# Patient Record
Sex: Female | Born: 2000 | Race: Black or African American | Hispanic: No | Marital: Single | Smoking: Never smoker
Health system: Southern US, Community
[De-identification: ages and names within clinical notes are randomized; demographics above are authoritative.]

## PROBLEM LIST (undated history)

## (undated) DIAGNOSIS — Z87828 Personal history of other (healed) physical injury and trauma: Secondary | ICD-10-CM

## (undated) DIAGNOSIS — S42009A Fracture of unspecified part of unspecified clavicle, initial encounter for closed fracture: Secondary | ICD-10-CM

## (undated) HISTORY — PX: NO PAST SURGERIES: SHX2092

---
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
## 2020-04-04 ENCOUNTER — Encounter: Payer: Self-pay | Admitting: *Deleted

## 2020-04-04 ENCOUNTER — Other Ambulatory Visit: Payer: Self-pay

## 2020-04-04 ENCOUNTER — Emergency Department
Admission: EM | Admit: 2020-04-04 | Discharge: 2020-04-04 | Disposition: A | Discharge status: Home / Self Care | Payer: No Typology Code available for payment source | Attending: Emergency Medicine | Admitting: Emergency Medicine

## 2020-04-04 DIAGNOSIS — R109 Unspecified abdominal pain: Secondary | ICD-10-CM | POA: Diagnosis present

## 2020-04-04 DIAGNOSIS — Z5321 Procedure and treatment not carried out due to patient leaving prior to being seen by health care provider: Secondary | ICD-10-CM | POA: Diagnosis not present

## 2020-04-04 LAB — COMPREHENSIVE METABOLIC PANEL
ALT: 12 U/L (ref 0–44)
AST: 14 U/L — ABNORMAL LOW (ref 15–41)
Albumin: 4.4 g/dL (ref 3.5–5.0)
Alkaline Phosphatase: 67 U/L (ref 38–126)
Anion gap: 7 (ref 5–15)
BUN: 14 mg/dL (ref 6–20)
CO2: 26 mmol/L (ref 22–32)
Calcium: 9 mg/dL (ref 8.9–10.3)
Chloride: 105 mmol/L (ref 98–111)
Creatinine, Ser: 0.89 mg/dL (ref 0.44–1.00)
GFR calc Af Amer: >60 mL/min (ref >60)
GFR calc non Af Amer: >60 mL/min (ref >60)
Glucose, Bld: 93 mg/dL (ref 70–99)
Potassium: 3.6 mmol/L (ref 3.5–5.1)
Sodium: 138 mmol/L (ref 135–145)
Total Bilirubin: 0.8 mg/dL (ref 0.3–1.2)
Total Protein: 7.5 g/dL (ref 6.5–8.1)

## 2020-04-04 LAB — URINALYSIS, COMPLETE (UACMP) WITH MICROSCOPIC
Bacteria, UA: NONE SEEN
Bilirubin Urine: NEGATIVE
Glucose, UA: NEGATIVE mg/dL
Hgb urine dipstick: NEGATIVE
Ketones, ur: NEGATIVE mg/dL
Leukocytes,Ua: NEGATIVE
Nitrite: NEGATIVE
Protein, ur: NEGATIVE mg/dL
Specific Gravity, Urine: 1.028 (ref 1.005–1.030)
pH: 5 (ref 5.0–8.0)

## 2020-04-04 LAB — CBC
HCT: 35.6 % — ABNORMAL LOW (ref 36.0–46.0)
Hemoglobin: 11.8 g/dL — ABNORMAL LOW (ref 12.0–15.0)
MCH: 28.2 pg (ref 26.0–34.0)
MCHC: 33.1 g/dL (ref 30.0–36.0)
MCV: 85.2 fL (ref 80.0–100.0)
Platelets: 237 10*3/uL (ref 150–400)
RBC: 4.18 MIL/uL (ref 3.87–5.11)
RDW: 12.2 % (ref 11.5–15.5)
WBC: 4.7 10*3/uL (ref 4.0–10.5)
nRBC: 0 % (ref 0.0–0.2)

## 2020-04-04 LAB — LIPASE, BLOOD: Lipase: 19 U/L (ref 11–51)

## 2020-04-04 LAB — POC URINE PREG, ED: Preg Test, Ur: NEGATIVE

## 2020-04-04 NOTE — ED Triage Notes (Signed)
PT to ED reporting RLQ abd pain x 2 weeks. Pain is intermittently sharp, tender upon palpation and worse after eating. No fevers, NVD or changes in urination.   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

## 2020-04-05 ENCOUNTER — Telehealth: Payer: Self-pay | Admitting: Emergency Medicine

## 2020-04-05 NOTE — Telephone Encounter (Signed)
Called patient due to lwot to inquire about condition and follow up plans. Left message.   

## 2020-05-12 ENCOUNTER — Other Ambulatory Visit: Payer: Self-pay

## 2020-05-12 ENCOUNTER — Ambulatory Visit
Admission: EM | Admit: 2020-05-12 | Discharge: 2020-05-12 | Disposition: A | Discharge status: Home / Self Care | Payer: No Typology Code available for payment source | Attending: Family Medicine | Admitting: Family Medicine

## 2020-05-12 ENCOUNTER — Ambulatory Visit (INDEPENDENT_AMBULATORY_CARE_PROVIDER_SITE_OTHER): Payer: No Typology Code available for payment source

## 2020-05-12 DIAGNOSIS — S93401A Sprain of unspecified ligament of right ankle, initial encounter: Secondary | ICD-10-CM

## 2020-05-12 NOTE — ED Triage Notes (Signed)
Patient states that around 8pm last night she was walking her dog and the dogs leash got hung on her leg and made her fall. States that she has been having constant right ankle pain since.

## 2020-05-12 NOTE — ED Provider Notes (Signed)
[facility] ____________________________________________  Time seen: Approximately 12:01 PM  I have reviewed the triage vital signs and the nursing notes.   HISTORY  Chief Complaint Ankle Injury (right)   HPI Anita Frederick is a 19 y.o. female presenting for evaluation of right ankle pain after injury that occurred last night.  Patient reports that she was walking her dog, and the dog's leash got tied around her ankle and she rolled her ankle.  Reports some pain since.  Will still weight-bear.  Denies pain radiation or paresthesias.  Reports did fall but denies other injuries.  Reports otherwise doing well.  Denies aggravating or alleviating factors.  Patient's last menstrual period was 05/01/2020.    Past Medical History:  Diagnosis Date  . Broken collarbone   . History of sprained ankle     There are no problems to display for this patient.   Past Surgical History:  Procedure Laterality Date  . NO PAST SURGERIES       No current facility-administered medications for this encounter.  Current Outpatient Medications:  .  cabergoline (DOSTINEX) 0.5 MG tablet, Take 0.25 mg by mouth 2 (two) times a week., Disp: , Rfl:  .  [place] 28 0.25-35 MG-MCG tablet, Take 1 tablet by mouth daily., Disp: , Rfl:   Allergies Orange fruit [citrus]  History reviewed. No pertinent family history.  Social History Social History   Tobacco Use  . Smoking status: Never Smoker  . Smokeless tobacco: Never Used  Substance Use Topics  . Alcohol use: No    Alcohol/week: 0.0 standard drinks  . Drug use: No    Review of Systems Constitutional: No fever Cardiovascular: Denies chest pain. Respiratory: Denies shortness of breath. Gastrointestinal: No abdominal pain.  No nausea, no vomiting.  No diarrhea.  Genitourinary: Negative for dysuria. Musculoskeletal: Positive right ankle pain. Skin: Negative for rash.   ____________________________________________   PHYSICAL  EXAM:  VITAL SIGNS: ED Triage Vitals  Enc Vitals Group     BP 05/12/20 0931 121/63     Pulse Rate 05/12/20 0931 70     Resp 05/12/20 0931 18     Temp 05/12/20 0931 98.2 F (36.8 C)     Temp Source 05/12/20 0931 Oral     SpO2 05/12/20 0931 99 %     Weight 05/12/20 0929 155 lb (70.3 kg)     Height 05/12/20 0929 5\' 8"  (1.727 m)     Head Circumference --      Peak Flow --      Pain Score 05/12/20 0929 8     Pain Loc --      Pain Edu? --      Excl. in [place]? --     Constitutional: Alert and oriented. Well appearing and in no acute distress. Eyes: Conjunctivae are normal.  ENT      Head: Normocephalic and atraumatic. Cardiovascular: Good peripheral circulation. Respiratory: Normal respiratory effort without tachypnea nor retractions.  Musculoskeletal: Steady gait.  Good distal right dorsalis pedis and posterior tibialis pulses. Right medial ankle along medial malleolus mild tenderness direct palpation, able to fully plantarflex dorsiflexion, mild pain with ankle rotation, right lower extremity otherwise nontender. Neurologic:  Normal speech and language. Speech is normal. No gait instability.  Skin:  Skin is warm, dry and intact. No rash noted. Psychiatric: Mood and affect are normal. Speech and behavior are normal. Patient exhibits appropriate insight and judgment   ___________________________________________   LABS (all labs ordered are listed, but only abnormal results  are displayed)  Labs Reviewed - No data to display  RADIOLOGY  DG Ankle Complete Right  Result Date: 05/12/2020 CLINICAL DATA:  Media pain started at 8 p.m. last night. Patient fell while walking her dog. EXAM: RIGHT ANKLE - COMPLETE 3+ VIEW COMPARISON:  None. FINDINGS: There is no evidence of fracture, dislocation, or joint effusion. There is no evidence of arthropathy or other focal bone abnormality. Soft tissues are unremarkable. IMPRESSION: Negative. Electronically Signed   By: Nolon Nations M.D.   On:  05/12/2020 10:44   ____________________________________________   PROCEDURES Procedures     INITIAL IMPRESSION / ASSESSMENT AND PLAN / ED COURSE  Pertinent labs & imaging results that were available during my care of the patient were reviewed by me and considered in my medical decision making (see chart for details).  Well-appearing patient.  No acute distress.  Right ankle pain post mechanical injury.  Right ankle x-ray as above radiologist, negative.  Suspect sprain injury.  Velcro ASO brace given for support.  Ice, elevate, Tylenol ibuprofen as needed.  Work note given.  Discussed follow up with Primary care physician this week as needed. Discussed follow up and return parameters including no resolution or any worsening concerns. Patient verbalized understanding and agreed to plan.   ____________________________________________   FINAL CLINICAL IMPRESSION(S) / ED DIAGNOSES  Final diagnoses:  Sprain of right ankle, unspecified ligament, initial encounter     ED Discharge Orders    None       Note: This dictation was prepared with Dragon dictation along with smaller phrase technology. Any transcriptional errors that result from this process are unintentional.         Marylene Land, NP 05/12/20 1204

## 2020-05-12 NOTE — Discharge Instructions (Signed)
Use brace for 3 days and as needed.  Ice.  Rest.  Elevate.  Gradually increase weightbearing and activity as tolerated.  Follow up with your primary care physician this week as needed. Return to Urgent care for new or worsening concerns.

## 2020-06-07 DIAGNOSIS — Z419 Encounter for procedure for purposes other than remedying health state, unspecified: Secondary | ICD-10-CM | POA: Diagnosis not present

## 2020-07-08 DIAGNOSIS — Z419 Encounter for procedure for purposes other than remedying health state, unspecified: Secondary | ICD-10-CM | POA: Diagnosis not present

## 2020-07-18 DIAGNOSIS — Z0000 Encounter for general adult medical examination without abnormal findings: Secondary | ICD-10-CM | POA: Diagnosis not present

## 2020-07-18 DIAGNOSIS — Z309 Encounter for contraceptive management, unspecified: Secondary | ICD-10-CM | POA: Diagnosis not present

## 2020-07-18 DIAGNOSIS — Z7189 Other specified counseling: Secondary | ICD-10-CM | POA: Diagnosis not present

## 2020-07-18 DIAGNOSIS — F418 Other specified anxiety disorders: Secondary | ICD-10-CM | POA: Diagnosis not present

## 2020-07-18 DIAGNOSIS — Z23 Encounter for immunization: Secondary | ICD-10-CM | POA: Diagnosis not present

## 2020-07-18 DIAGNOSIS — Z1389 Encounter for screening for other disorder: Secondary | ICD-10-CM | POA: Diagnosis not present

## 2020-07-23 DIAGNOSIS — Z20822 Contact with and (suspected) exposure to covid-19: Secondary | ICD-10-CM | POA: Diagnosis not present

## 2020-08-08 DIAGNOSIS — Z419 Encounter for procedure for purposes other than remedying health state, unspecified: Secondary | ICD-10-CM | POA: Diagnosis not present

## 2020-08-19 DIAGNOSIS — Z20822 Contact with and (suspected) exposure to covid-19: Secondary | ICD-10-CM | POA: Diagnosis not present

## 2020-09-07 DIAGNOSIS — Z419 Encounter for procedure for purposes other than remedying health state, unspecified: Secondary | ICD-10-CM | POA: Diagnosis not present

## 2020-09-26 DIAGNOSIS — L309 Dermatitis, unspecified: Secondary | ICD-10-CM | POA: Diagnosis not present

## 2020-10-08 DIAGNOSIS — Z419 Encounter for procedure for purposes other than remedying health state, unspecified: Secondary | ICD-10-CM | POA: Diagnosis not present

## 2020-10-29 DIAGNOSIS — D352 Benign neoplasm of pituitary gland: Secondary | ICD-10-CM | POA: Diagnosis not present

## 2020-11-02 DIAGNOSIS — H5213 Myopia, bilateral: Secondary | ICD-10-CM | POA: Diagnosis not present

## 2020-11-07 DIAGNOSIS — Z419 Encounter for procedure for purposes other than remedying health state, unspecified: Secondary | ICD-10-CM | POA: Diagnosis not present

## 2020-12-08 DIAGNOSIS — Z419 Encounter for procedure for purposes other than remedying health state, unspecified: Secondary | ICD-10-CM | POA: Diagnosis not present

## 2020-12-18 ENCOUNTER — Other Ambulatory Visit: Payer: Self-pay

## 2020-12-18 ENCOUNTER — Ambulatory Visit
Admission: EM | Admit: 2020-12-18 | Discharge: 2020-12-18 | Disposition: A | Discharge status: Home / Self Care | Payer: Medicaid Other | Attending: Sports Medicine | Admitting: Sports Medicine

## 2020-12-18 DIAGNOSIS — Z20822 Contact with and (suspected) exposure to covid-19: Secondary | ICD-10-CM | POA: Diagnosis not present

## 2020-12-18 NOTE — Discharge Instructions (Signed)

## 2020-12-18 NOTE — ED Triage Notes (Signed)
Patient states that she has was exposed to covid and currently does not have symptoms.

## 2020-12-19 LAB — SARS CORONAVIRUS 2 (TAT 6-24 HRS): SARS Coronavirus 2: NEGATIVE

## 2021-01-08 DIAGNOSIS — Z419 Encounter for procedure for purposes other than remedying health state, unspecified: Secondary | ICD-10-CM | POA: Diagnosis not present

## 2021-02-05 DIAGNOSIS — Z419 Encounter for procedure for purposes other than remedying health state, unspecified: Secondary | ICD-10-CM | POA: Diagnosis not present

## 2021-02-14 IMAGING — CR DG ANKLE COMPLETE 3+V*R*
3 series · 3 of 3 positions shown · non-contrast
Comparison: None.

CLINICAL DATA: Media pain started at 8 p.m. last night. Patient
fell while walking her dog.

EXAM:
RIGHT ANKLE - COMPLETE 3+ VIEW

[ankle ap]
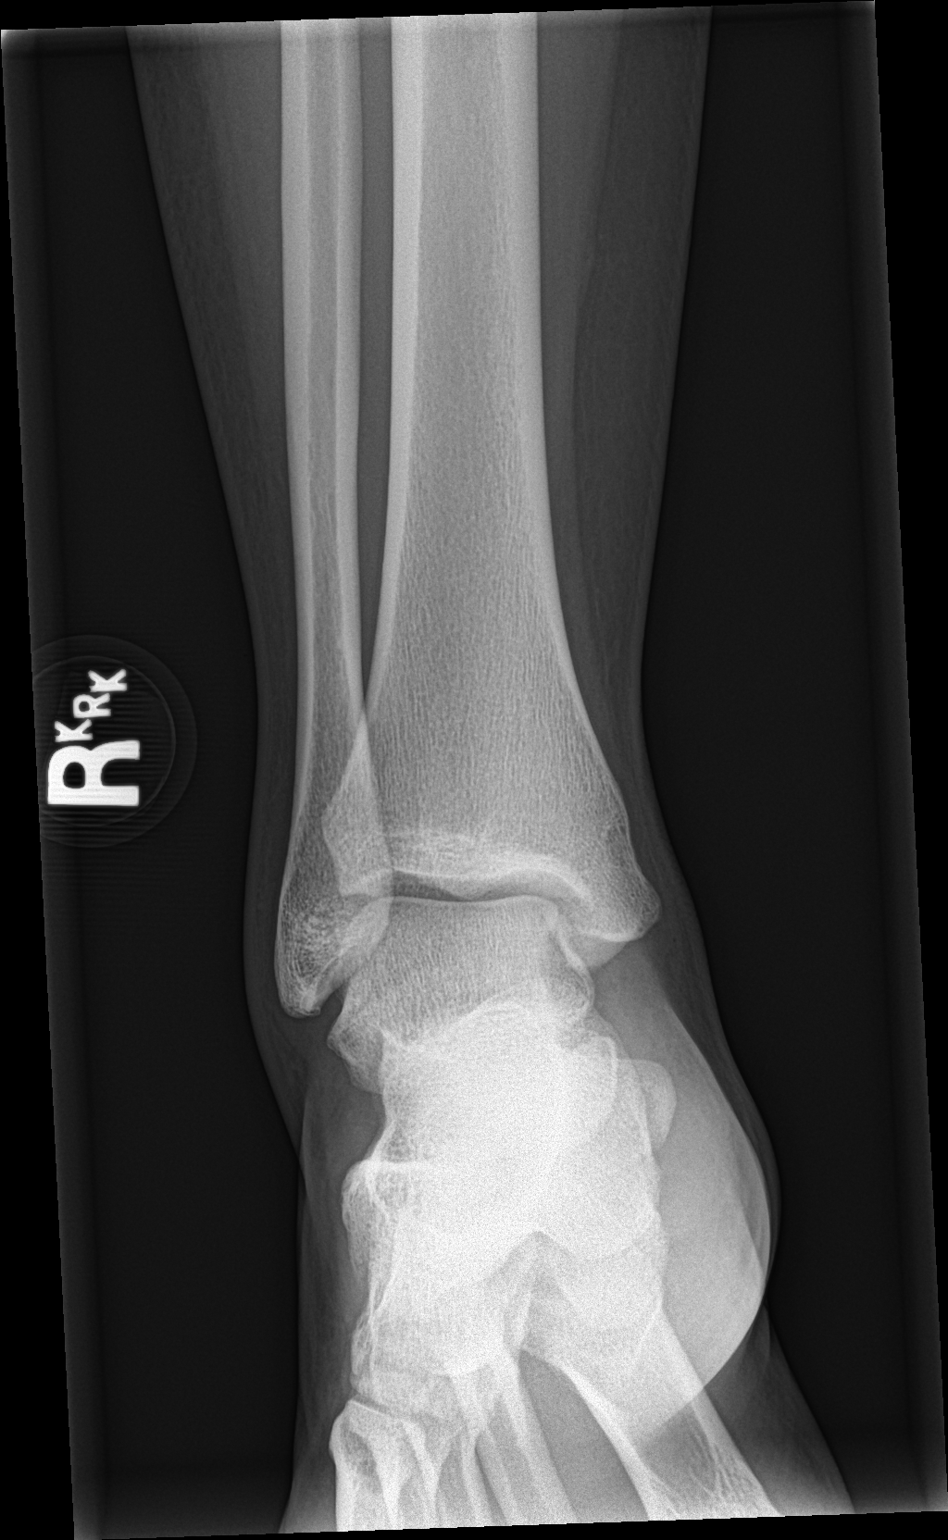

[ankle obl]
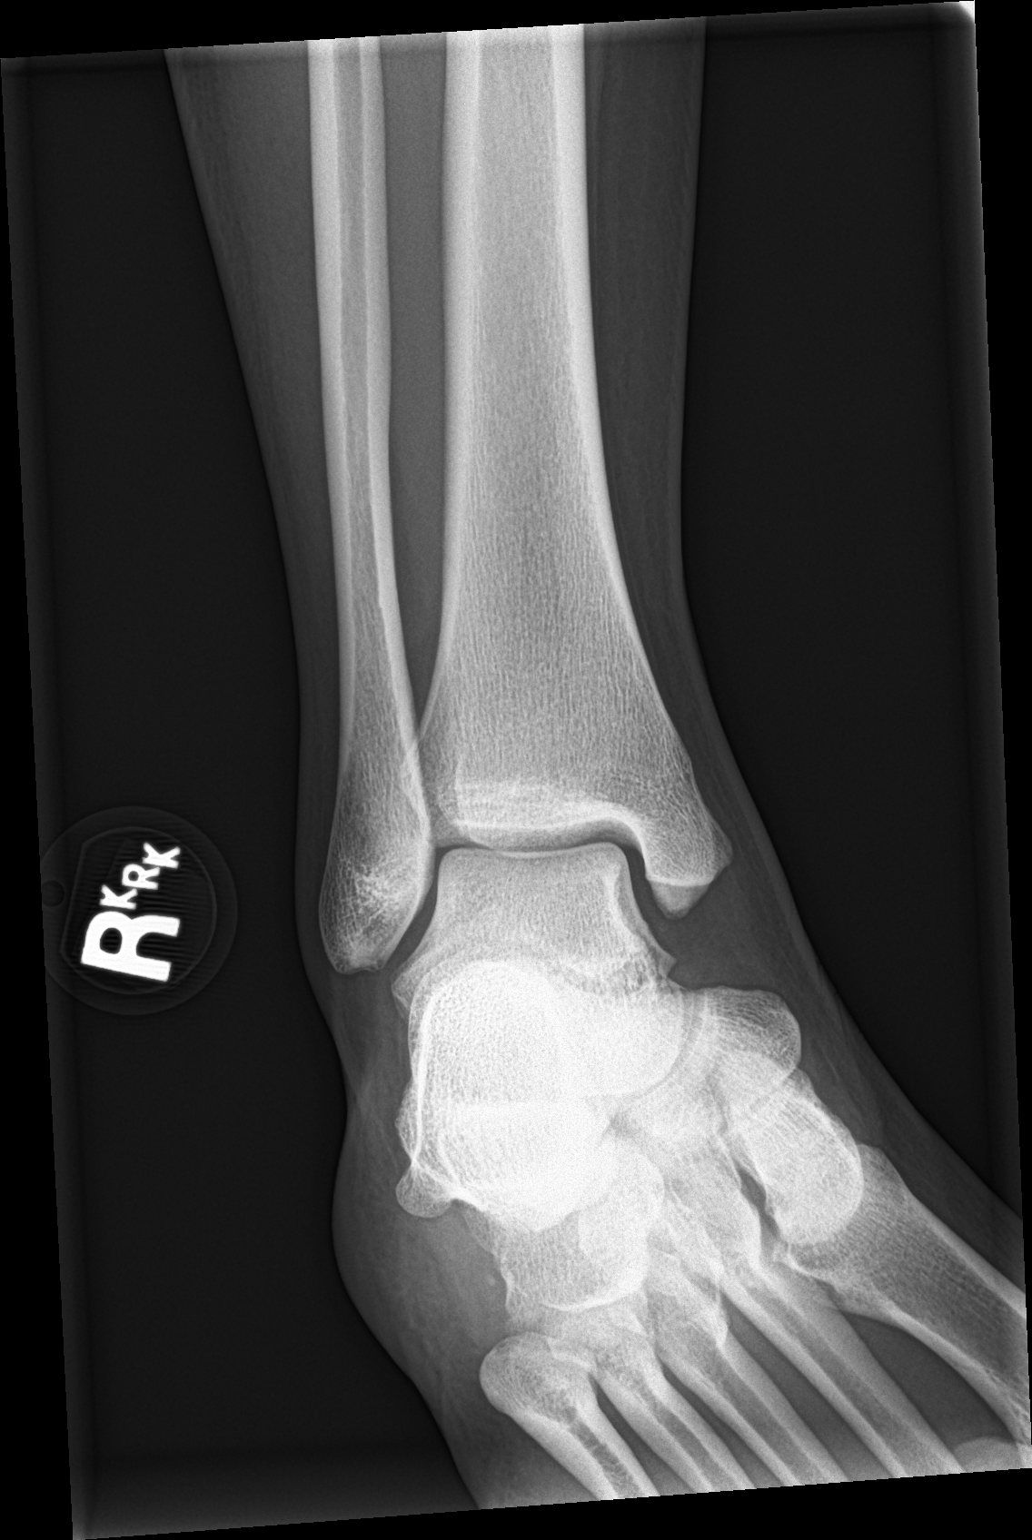

[ankle lat]
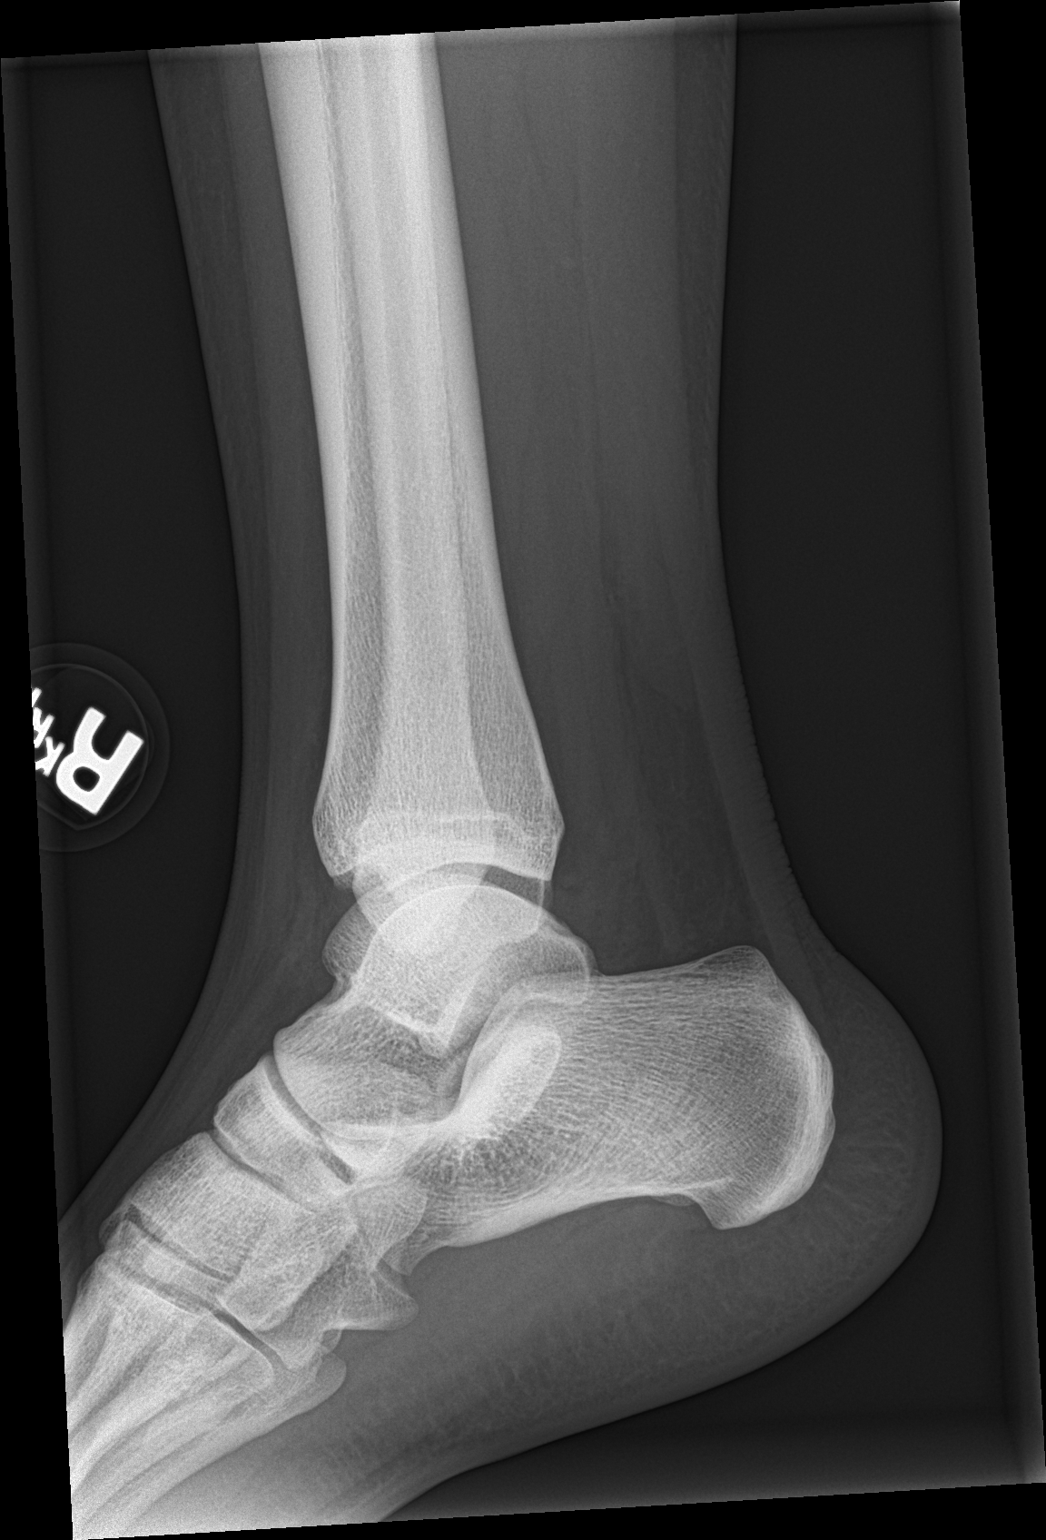

[3 of 3 positions shown; findings below may reference images not displayed]

FINDINGS: There is no evidence of fracture, dislocation, or joint effusion.
There is no evidence of arthropathy or other focal bone abnormality.
Soft tissues are unremarkable.
IMPRESSION: Negative.

## 2021-03-08 DIAGNOSIS — Z419 Encounter for procedure for purposes other than remedying health state, unspecified: Secondary | ICD-10-CM | POA: Diagnosis not present

## 2021-04-07 DIAGNOSIS — Z419 Encounter for procedure for purposes other than remedying health state, unspecified: Secondary | ICD-10-CM | POA: Diagnosis not present

## 2021-05-08 DIAGNOSIS — Z419 Encounter for procedure for purposes other than remedying health state, unspecified: Secondary | ICD-10-CM | POA: Diagnosis not present

## 2021-06-07 DIAGNOSIS — Z419 Encounter for procedure for purposes other than remedying health state, unspecified: Secondary | ICD-10-CM | POA: Diagnosis not present

## 2021-07-08 DIAGNOSIS — Z419 Encounter for procedure for purposes other than remedying health state, unspecified: Secondary | ICD-10-CM | POA: Diagnosis not present

## 2021-08-08 DIAGNOSIS — Z419 Encounter for procedure for purposes other than remedying health state, unspecified: Secondary | ICD-10-CM | POA: Diagnosis not present

## 2021-09-07 DIAGNOSIS — Z419 Encounter for procedure for purposes other than remedying health state, unspecified: Secondary | ICD-10-CM | POA: Diagnosis not present

## 2021-10-08 DIAGNOSIS — Z419 Encounter for procedure for purposes other than remedying health state, unspecified: Secondary | ICD-10-CM | POA: Diagnosis not present

## 2021-11-07 DIAGNOSIS — Z419 Encounter for procedure for purposes other than remedying health state, unspecified: Secondary | ICD-10-CM | POA: Diagnosis not present

## 2021-12-08 DIAGNOSIS — Z419 Encounter for procedure for purposes other than remedying health state, unspecified: Secondary | ICD-10-CM | POA: Diagnosis not present

## 2022-01-08 DIAGNOSIS — Z419 Encounter for procedure for purposes other than remedying health state, unspecified: Secondary | ICD-10-CM | POA: Diagnosis not present

## 2022-02-05 DIAGNOSIS — Z419 Encounter for procedure for purposes other than remedying health state, unspecified: Secondary | ICD-10-CM | POA: Diagnosis not present

## 2022-03-08 DIAGNOSIS — Z419 Encounter for procedure for purposes other than remedying health state, unspecified: Secondary | ICD-10-CM | POA: Diagnosis not present

## 2022-04-07 DIAGNOSIS — Z419 Encounter for procedure for purposes other than remedying health state, unspecified: Secondary | ICD-10-CM | POA: Diagnosis not present

## 2022-05-08 DIAGNOSIS — Z419 Encounter for procedure for purposes other than remedying health state, unspecified: Secondary | ICD-10-CM | POA: Diagnosis not present

## 2022-05-15 DIAGNOSIS — R112 Nausea with vomiting, unspecified: Secondary | ICD-10-CM | POA: Diagnosis not present

## 2022-05-15 DIAGNOSIS — Z5321 Procedure and treatment not carried out due to patient leaving prior to being seen by health care provider: Secondary | ICD-10-CM | POA: Diagnosis not present

## 2022-05-15 DIAGNOSIS — R1030 Lower abdominal pain, unspecified: Secondary | ICD-10-CM | POA: Insufficient documentation

## 2022-05-16 ENCOUNTER — Emergency Department (HOSPITAL_COMMUNITY)
Admission: EM | Admit: 2022-05-16 | Discharge: 2022-05-16 | Disposition: A | Discharge status: Home / Self Care | Payer: Medicaid Other | Attending: Emergency Medicine | Admitting: Emergency Medicine

## 2022-05-16 ENCOUNTER — Other Ambulatory Visit: Payer: Self-pay

## 2022-05-16 ENCOUNTER — Encounter (HOSPITAL_COMMUNITY): Payer: Self-pay | Admitting: Emergency Medicine

## 2022-05-16 LAB — URINALYSIS, ROUTINE W REFLEX MICROSCOPIC
Bilirubin Urine: NEGATIVE
Glucose, UA: NEGATIVE mg/dL
Ketones, ur: NEGATIVE mg/dL
Nitrite: NEGATIVE
Protein, ur: NEGATIVE mg/dL
Specific Gravity, Urine: 1.011 (ref 1.005–1.030)
pH: 6 (ref 5.0–8.0)

## 2022-05-16 LAB — CBC
HCT: 35.4 % — ABNORMAL LOW (ref 36.0–46.0)
Hemoglobin: 11.4 g/dL — ABNORMAL LOW (ref 12.0–15.0)
MCH: 28.3 pg (ref 26.0–34.0)
MCHC: 32.2 g/dL (ref 30.0–36.0)
MCV: 87.8 fL (ref 80.0–100.0)
Platelets: 257 10*3/uL (ref 150–400)
RBC: 4.03 MIL/uL (ref 3.87–5.11)
RDW: 12 % (ref 11.5–15.5)
WBC: 6.3 10*3/uL (ref 4.0–10.5)
nRBC: 0 % (ref 0.0–0.2)

## 2022-05-16 LAB — COMPREHENSIVE METABOLIC PANEL
ALT: 11 U/L (ref 0–44)
AST: 17 U/L (ref 15–41)
Albumin: 4 g/dL (ref 3.5–5.0)
Alkaline Phosphatase: 69 U/L (ref 38–126)
Anion gap: 8 (ref 5–15)
BUN: 16 mg/dL (ref 6–20)
CO2: 26 mmol/L (ref 22–32)
Calcium: 9.3 mg/dL (ref 8.9–10.3)
Chloride: 104 mmol/L (ref 98–111)
Creatinine, Ser: 0.97 mg/dL (ref 0.44–1.00)
GFR, Estimated: >60 mL/min (ref >60)
Glucose, Bld: 82 mg/dL (ref 70–99)
Potassium: 3.7 mmol/L (ref 3.5–5.1)
Sodium: 138 mmol/L (ref 135–145)
Total Bilirubin: 0.5 mg/dL (ref 0.3–1.2)
Total Protein: 7.1 g/dL (ref 6.5–8.1)

## 2022-05-16 LAB — LIPASE, BLOOD: Lipase: 26 U/L (ref 11–51)

## 2022-05-16 LAB — I-STAT BETA HCG BLOOD, ED (MC, WL, AP ONLY): I-stat hCG, quantitative: <5 m[IU]/mL (ref <5)

## 2022-05-16 NOTE — ED Triage Notes (Signed)
Pt reported to ED with c/o pain to suprapubic area and nausea vomiting. Pt also states she has a possible infection to cuticle of 3rd finger on right hand.

## 2022-06-07 DIAGNOSIS — Z419 Encounter for procedure for purposes other than remedying health state, unspecified: Secondary | ICD-10-CM | POA: Diagnosis not present

## 2022-07-08 DIAGNOSIS — Z419 Encounter for procedure for purposes other than remedying health state, unspecified: Secondary | ICD-10-CM | POA: Diagnosis not present

## 2022-08-08 DIAGNOSIS — Z419 Encounter for procedure for purposes other than remedying health state, unspecified: Secondary | ICD-10-CM | POA: Diagnosis not present

## 2022-09-07 DIAGNOSIS — Z419 Encounter for procedure for purposes other than remedying health state, unspecified: Secondary | ICD-10-CM | POA: Diagnosis not present

## 2022-10-08 DIAGNOSIS — Z419 Encounter for procedure for purposes other than remedying health state, unspecified: Secondary | ICD-10-CM | POA: Diagnosis not present

## 2022-11-07 DIAGNOSIS — Z419 Encounter for procedure for purposes other than remedying health state, unspecified: Secondary | ICD-10-CM | POA: Diagnosis not present

## 2022-12-08 DIAGNOSIS — Z419 Encounter for procedure for purposes other than remedying health state, unspecified: Secondary | ICD-10-CM | POA: Diagnosis not present

## 2023-01-08 DIAGNOSIS — Z419 Encounter for procedure for purposes other than remedying health state, unspecified: Secondary | ICD-10-CM | POA: Diagnosis not present

## 2023-02-06 DIAGNOSIS — Z419 Encounter for procedure for purposes other than remedying health state, unspecified: Secondary | ICD-10-CM | POA: Diagnosis not present

## 2023-03-09 DIAGNOSIS — Z419 Encounter for procedure for purposes other than remedying health state, unspecified: Secondary | ICD-10-CM | POA: Diagnosis not present

## 2023-04-08 DIAGNOSIS — Z419 Encounter for procedure for purposes other than remedying health state, unspecified: Secondary | ICD-10-CM | POA: Diagnosis not present

## 2023-05-09 DIAGNOSIS — Z419 Encounter for procedure for purposes other than remedying health state, unspecified: Secondary | ICD-10-CM | POA: Diagnosis not present

## 2023-06-08 DIAGNOSIS — Z419 Encounter for procedure for purposes other than remedying health state, unspecified: Secondary | ICD-10-CM | POA: Diagnosis not present

## 2023-07-09 DIAGNOSIS — Z419 Encounter for procedure for purposes other than remedying health state, unspecified: Secondary | ICD-10-CM | POA: Diagnosis not present

## 2023-08-09 DIAGNOSIS — Z419 Encounter for procedure for purposes other than remedying health state, unspecified: Secondary | ICD-10-CM | POA: Diagnosis not present

## 2023-09-08 DIAGNOSIS — Z419 Encounter for procedure for purposes other than remedying health state, unspecified: Secondary | ICD-10-CM | POA: Diagnosis not present

## 2023-10-09 DIAGNOSIS — Z419 Encounter for procedure for purposes other than remedying health state, unspecified: Secondary | ICD-10-CM | POA: Diagnosis not present

## 2023-11-08 DIAGNOSIS — Z419 Encounter for procedure for purposes other than remedying health state, unspecified: Secondary | ICD-10-CM | POA: Diagnosis not present

## 2023-12-09 DIAGNOSIS — Z419 Encounter for procedure for purposes other than remedying health state, unspecified: Secondary | ICD-10-CM | POA: Diagnosis not present

## 2024-01-09 DIAGNOSIS — Z419 Encounter for procedure for purposes other than remedying health state, unspecified: Secondary | ICD-10-CM | POA: Diagnosis not present

## 2024-02-06 DIAGNOSIS — Z419 Encounter for procedure for purposes other than remedying health state, unspecified: Secondary | ICD-10-CM | POA: Diagnosis not present

## 2024-03-19 DIAGNOSIS — Z419 Encounter for procedure for purposes other than remedying health state, unspecified: Secondary | ICD-10-CM | POA: Diagnosis not present

## 2024-04-18 DIAGNOSIS — Z419 Encounter for procedure for purposes other than remedying health state, unspecified: Secondary | ICD-10-CM | POA: Diagnosis not present

## 2024-05-19 DIAGNOSIS — Z419 Encounter for procedure for purposes other than remedying health state, unspecified: Secondary | ICD-10-CM | POA: Diagnosis not present

## 2024-06-18 DIAGNOSIS — Z419 Encounter for procedure for purposes other than remedying health state, unspecified: Secondary | ICD-10-CM | POA: Diagnosis not present

## 2024-07-19 DIAGNOSIS — Z419 Encounter for procedure for purposes other than remedying health state, unspecified: Secondary | ICD-10-CM | POA: Diagnosis not present

## 2024-08-19 DIAGNOSIS — Z419 Encounter for procedure for purposes other than remedying health state, unspecified: Secondary | ICD-10-CM | POA: Diagnosis not present
